# Patient Record
Sex: Female | Born: 1980 | Race: White | Hispanic: No | Marital: Married
Health system: Southern US, Community
[De-identification: ages and names within clinical notes are randomized; demographics above are authoritative.]

## PROBLEM LIST (undated history)

## (undated) ENCOUNTER — Emergency Department (HOSPITAL_COMMUNITY): Admission: EM | Discharge status: Absent without leave | Payer: Self-pay

---

## 2006-10-16 ENCOUNTER — Emergency Department (HOSPITAL_COMMUNITY): Admission: AD | Admit: 2006-10-16 | Discharge: 2006-10-16 | Payer: Self-pay | Admitting: Emergency Medicine

## 2007-07-10 ENCOUNTER — Emergency Department: Payer: Self-pay

## 2009-08-10 ENCOUNTER — Emergency Department (HOSPITAL_COMMUNITY): Admission: EM | Admit: 2009-08-10 | Discharge: 2009-08-10 | Payer: Self-pay | Admitting: Emergency Medicine

## 2010-06-28 ENCOUNTER — Encounter: Payer: Self-pay | Admitting: Family Medicine

## 2010-11-15 NOTE — Letter (Signed)
Summary: Records Dated 03-05-96 thru 03-19-09/[facility]  Records Dated 03-05-96 thru 03-19-09/[facility]   Imported By: Lanelle Bal 06/28/2010 12:12:52  _____________________________________________________________________  External Attachment:    Type:   Image     Comment:   External Document
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
## 2015-08-13 ENCOUNTER — Other Ambulatory Visit: Payer: Self-pay | Admitting: Family Medicine

## 2015-08-13 ENCOUNTER — Ambulatory Visit
Admission: RE | Admit: 2015-08-13 | Discharge: 2015-08-13 | Disposition: A | Discharge status: Home / Self Care | Payer: BLUE CROSS/BLUE SHIELD | Source: Ambulatory Visit | Attending: Family Medicine | Admitting: Family Medicine

## 2015-08-13 ENCOUNTER — Ambulatory Visit: Admission: RE | Admit: 2015-08-13 | Payer: Self-pay | Source: Ambulatory Visit

## 2015-08-13 DIAGNOSIS — J340 Abscess, furuncle and carbuncle of nose: Secondary | ICD-10-CM

## 2015-08-13 DIAGNOSIS — J019 Acute sinusitis, unspecified: Secondary | ICD-10-CM | POA: Diagnosis present

## 2015-08-13 MED ORDER — IOHEXOL 300 MG/ML  SOLN
75.0000 mL | Freq: Once | INTRAMUSCULAR | Status: AC | PRN
  Administered 2015-08-13: 75 mL via INTRAVENOUS

## 2016-12-24 IMAGING — CT CT MAXILLOFACIAL W/ CM
3 series · 16 of 47 positions shown, 19 images · IV contrast (omnipaque)
Comparison: None.

CLINICAL DATA: Left facial pain with nasal swelling and redness as
well as tooth pain worsening 3-4 days.

EXAM:
CT MAXILLOFACIAL WITH CONTRAST
TECHNIQUE: Multidetector CT imaging of the maxillofacial structures was
performed with intravenous contrast. Multiplanar CT image
reconstructions were also generated. A small metallic BB was placed
on the right temple in order to reliably differentiate right from
left.
CONTRAST:  75 mL Omnipaque 300 IV.

[Series 2: max soft · axial · 0.31mm/px · z∈[-229,-81]mm · 10 of 86 slices shown, 13 images]
[im 6/86  brain]
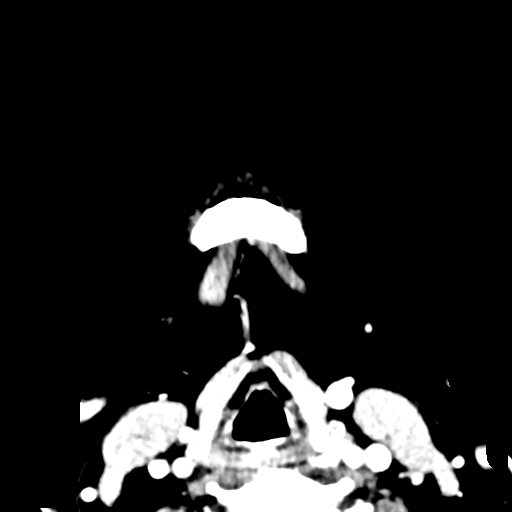
[im 6/86  bone]
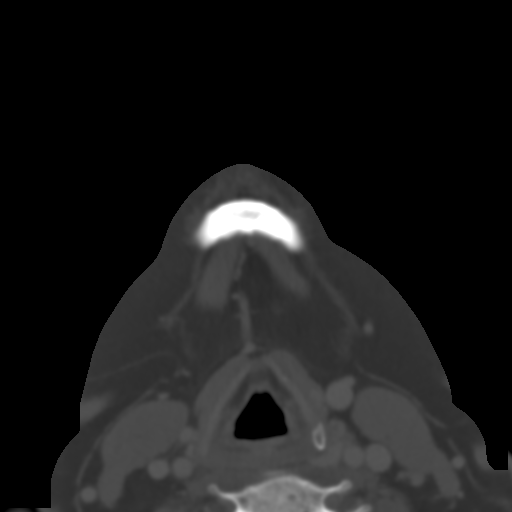
[im 15/86  bone]
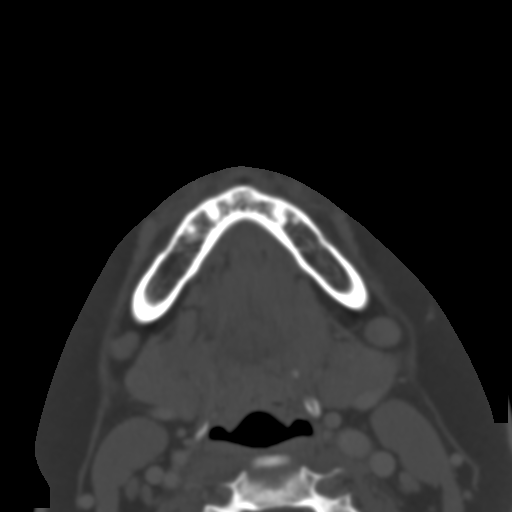
[im 24/86  bone]
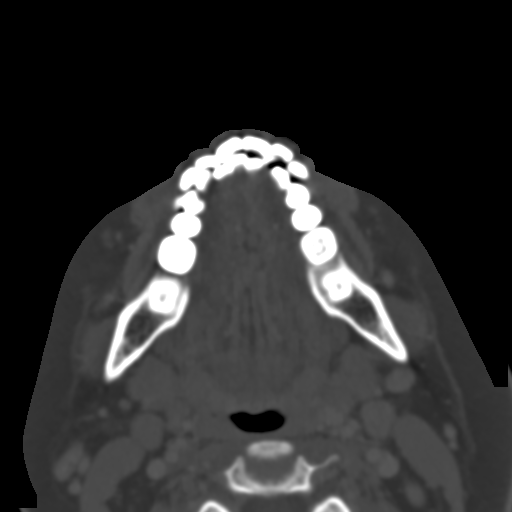
[im 30/86  bone]
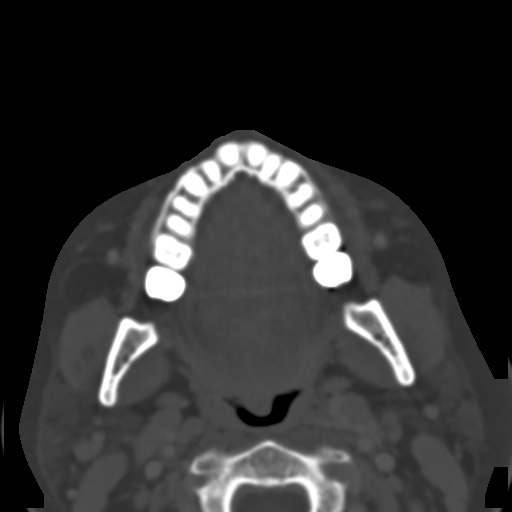
[im 39/86  brain]
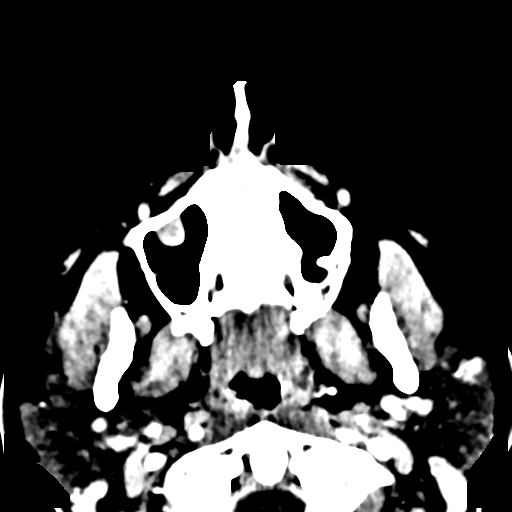
[im 39/86  bone]
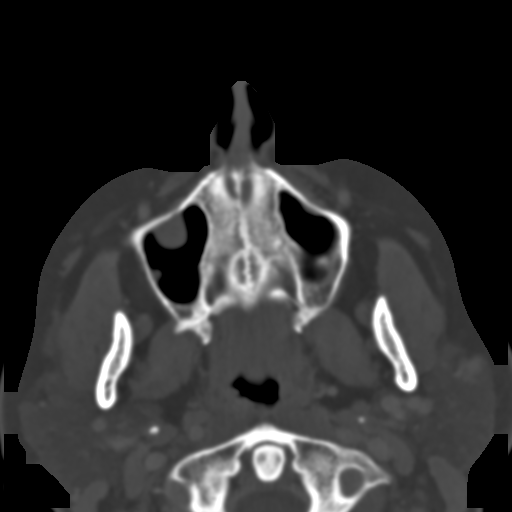
[im 47/86  bone]
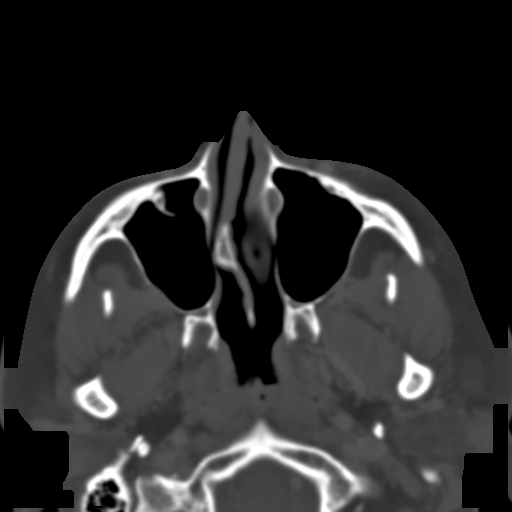
[im 56/86  bone]
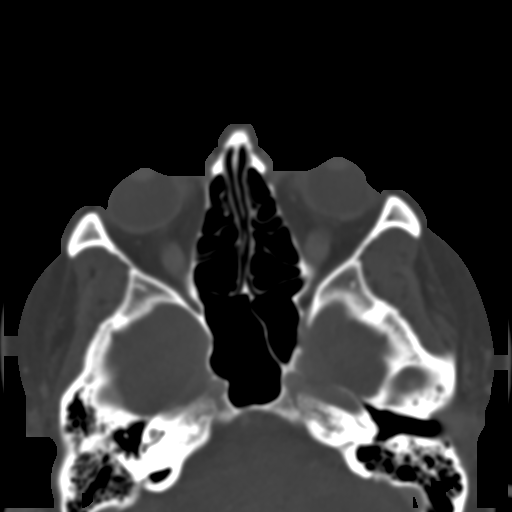
[im 65/86  bone]
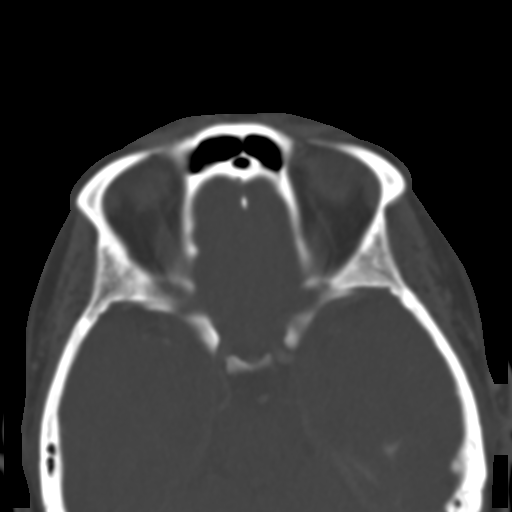
[im 71/86  brain]
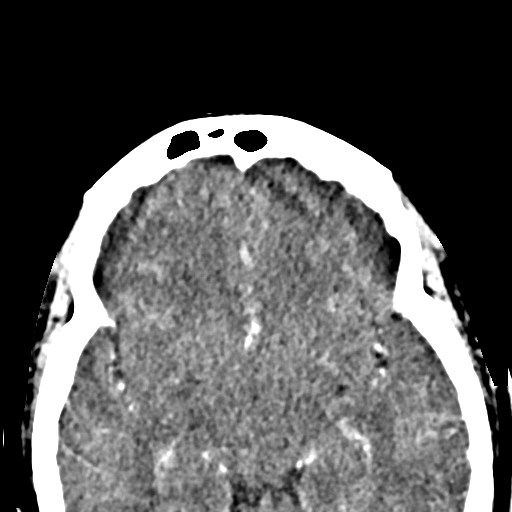
[im 71/86  bone]
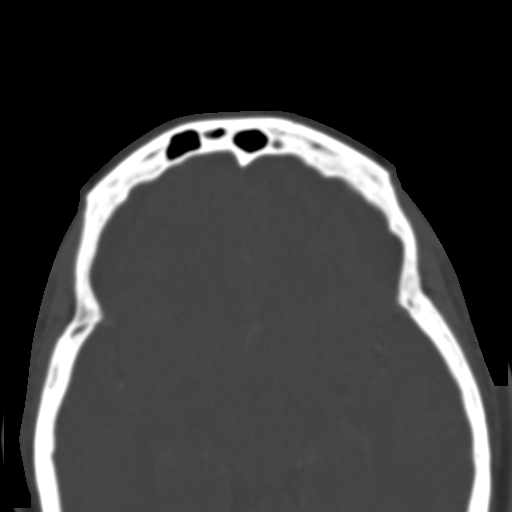
[im 80/86  bone]
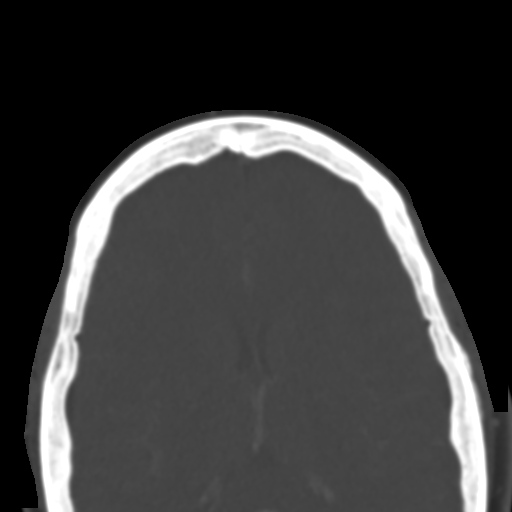

[Series 4: coronal soft · coronal · 0.35mm/px · 3 of 71 slices shown]
[im 24/71  bone]
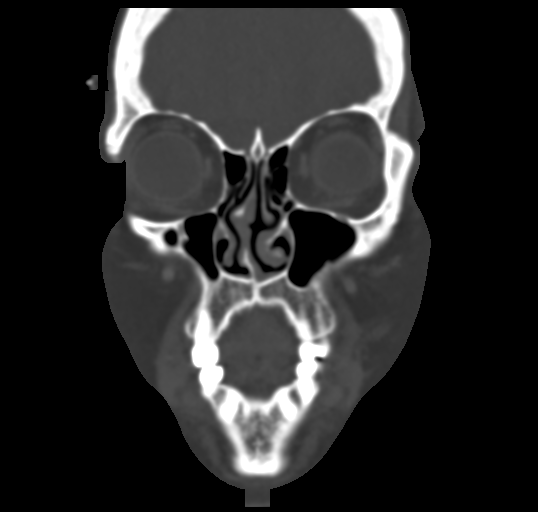
[im 32/71  bone]
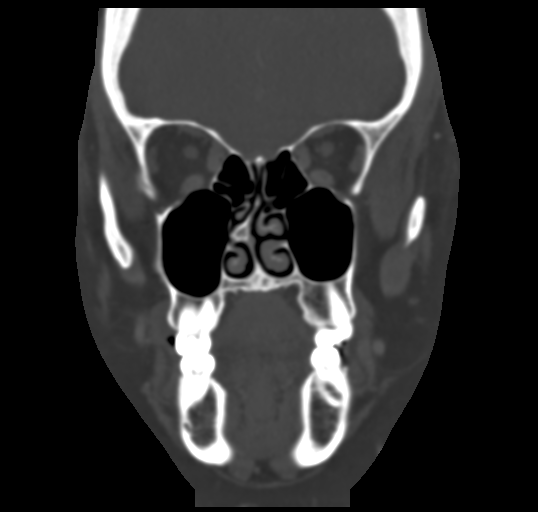
[im 39/71  bone]
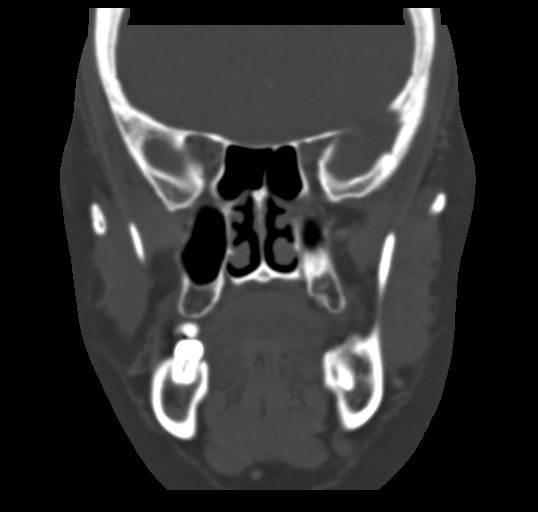

[Series 5: sagittal soft · sagittal · 0.30mm/px · 3 of 85 slices shown]
[im 29/85  bone]
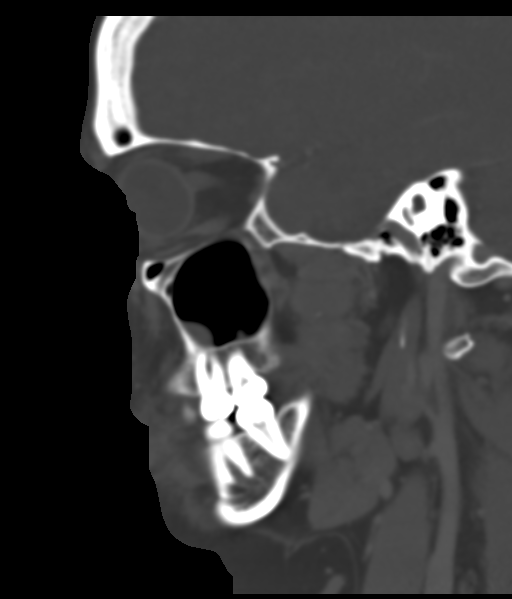
[im 43/85  bone]
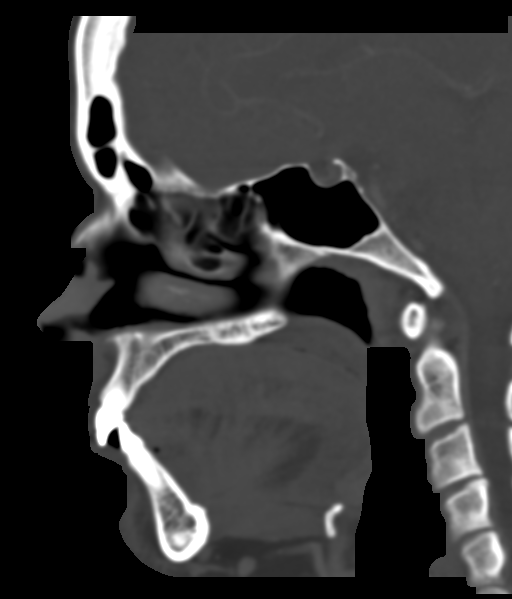
[im 57/85  bone]
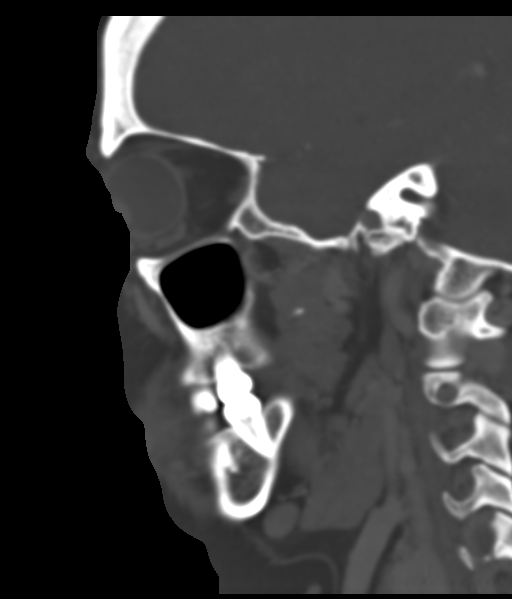

[16 of 47 positions shown; findings below may reference images not displayed]

FINDINGS: Orbits are normal and symmetric. Paranasal sinuses are well aerated
with very minimal mucosal membrane thickening over the floor the
right maxillary sinus. Visualized intracranial structures are
normal.

Salivary glands are normal and symmetric. There is no significant
adenopathy. Pharynx and larynx are within normal. Epiglottis is
normal. No significant periodontal disease. Remainder the exam is
unremarkable.
IMPRESSION: No acute findings.

## 2018-09-16 ENCOUNTER — Encounter: Payer: Self-pay | Admitting: Medical Oncology

## 2018-09-16 ENCOUNTER — Emergency Department
Admission: EM | Admit: 2018-09-16 | Discharge: 2018-09-16 | Disposition: A | Discharge status: Home / Self Care | Payer: Managed Care, Other (non HMO) | Attending: Emergency Medicine | Admitting: Emergency Medicine

## 2018-09-16 DIAGNOSIS — L03114 Cellulitis of left upper limb: Secondary | ICD-10-CM | POA: Insufficient documentation

## 2018-09-16 DIAGNOSIS — M25522 Pain in left elbow: Secondary | ICD-10-CM | POA: Diagnosis present

## 2018-09-16 LAB — CBC WITH DIFFERENTIAL/PLATELET
Abs Immature Granulocytes: 0.03 10*3/uL (ref 0.00–0.07)
BASOS PCT: 1 %
Basophils Absolute: 0.1 10*3/uL (ref 0.0–0.1)
EOS ABS: 0.1 10*3/uL (ref 0.0–0.5)
Eosinophils Relative: 1 %
HCT: 45 % (ref 36.0–46.0)
Hemoglobin: 14.7 g/dL (ref 12.0–15.0)
Immature Granulocytes: 0 %
Lymphocytes Relative: 20 %
Lymphs Abs: 2.1 10*3/uL (ref 0.7–4.0)
MCH: 30.9 pg (ref 26.0–34.0)
MCHC: 32.7 g/dL (ref 30.0–36.0)
MCV: 94.5 fL (ref 80.0–100.0)
MONO ABS: 0.5 10*3/uL (ref 0.1–1.0)
MONOS PCT: 5 %
NEUTROS PCT: 73 %
Neutro Abs: 7.6 10*3/uL (ref 1.7–7.7)
Platelets: 289 10*3/uL (ref 150–400)
RBC: 4.76 MIL/uL (ref 3.87–5.11)
RDW: 12 % (ref 11.5–15.5)
WBC: 10.4 10*3/uL (ref 4.0–10.5)
nRBC: 0 % (ref 0.0–0.2)

## 2018-09-16 LAB — COMPREHENSIVE METABOLIC PANEL
ALK PHOS: 76 U/L (ref 38–126)
ALT: 11 U/L (ref 0–44)
ANION GAP: 11 (ref 5–15)
AST: 17 U/L (ref 15–41)
Albumin: 4.5 g/dL (ref 3.5–5.0)
BILIRUBIN TOTAL: 1.5 mg/dL — AB (ref 0.3–1.2)
BUN: 14 mg/dL (ref 6–20)
CALCIUM: 9.1 mg/dL (ref 8.9–10.3)
CO2: 22 mmol/L (ref 22–32)
Chloride: 106 mmol/L (ref 98–111)
Creatinine, Ser: 0.74 mg/dL (ref 0.44–1.00)
GFR calc Af Amer: >60 mL/min (ref >60)
Glucose, Bld: 84 mg/dL (ref 70–99)
Potassium: 4 mmol/L (ref 3.5–5.1)
Sodium: 139 mmol/L (ref 135–145)
TOTAL PROTEIN: 7.6 g/dL (ref 6.5–8.1)

## 2018-09-16 LAB — CG4 I-STAT (LACTIC ACID)
LACTIC ACID, VENOUS: 2.42 mmol/L — AB (ref 0.5–1.9)
Lactic Acid, Venous: 2.15 mmol/L (ref 0.5–1.9)

## 2018-09-16 MED ORDER — CLINDAMYCIN PHOSPHATE 600 MG/50ML IV SOLN
600.0000 mg | Freq: Once | INTRAVENOUS | Status: AC
  Administered 2018-09-16: 600 mg via INTRAVENOUS
  Filled 2018-09-16 (×2): qty 50

## 2018-09-16 MED ORDER — MORPHINE SULFATE (PF) 4 MG/ML IV SOLN
4.0000 mg | Freq: Once | INTRAVENOUS | Status: AC
  Administered 2018-09-16: 4 mg via INTRAVENOUS
  Filled 2018-09-16: qty 1

## 2018-09-16 MED ORDER — ACETAMINOPHEN 500 MG PO TABS: 1000.0000 mg | ORAL_TABLET | Freq: Once | ORAL | Status: DC

## 2018-09-16 MED ORDER — CLINDAMYCIN HCL 150 MG PO CAPS
300.0000 mg | ORAL_CAPSULE | Freq: Once | ORAL | Status: AC
  Administered 2018-09-16: 300 mg via ORAL
  Filled 2018-09-16: qty 2

## 2018-09-16 MED ORDER — TRAMADOL HCL 50 MG PO TABS: 50.0000 mg | ORAL_TABLET | Freq: Four times a day (QID) | ORAL | 0 refills | Status: AC | PRN

## 2018-09-16 MED ORDER — CLINDAMYCIN HCL 300 MG PO CAPS: 300.0000 mg | ORAL_CAPSULE | Freq: Four times a day (QID) | ORAL | 0 refills | Status: AC

## 2018-09-16 MED ORDER — ONDANSETRON HCL 4 MG/2ML IJ SOLN
4.0000 mg | Freq: Once | INTRAMUSCULAR | Status: AC
  Administered 2018-09-16: 4 mg via INTRAVENOUS
  Filled 2018-09-16: qty 2

## 2018-09-16 MED ORDER — SODIUM CHLORIDE 0.9 % IV BOLUS
1000.0000 mL | Freq: Once | INTRAVENOUS | Status: AC
  Administered 2018-09-16: 1000 mL via INTRAVENOUS

## 2018-09-16 MED ORDER — IBUPROFEN 800 MG PO TABS
800.0000 mg | ORAL_TABLET | Freq: Once | ORAL | Status: AC
  Administered 2018-09-16: 800 mg via ORAL
  Filled 2018-09-16: qty 1

## 2018-09-16 NOTE — ED Notes (Signed)
Pt ambulatory to toilet independently. 

## 2018-09-16 NOTE — ED Notes (Signed)
Edges of cellulitis to left elbow marked with skin pen per this RN.

## 2018-09-16 NOTE — ED Notes (Signed)
Date and time results received: 09/16/18 11:22 AM  (use smartphrase ".now" to insert current time)  Test: Lactic Critical Value: 2.42  Name of Provider Notified: Dr. Shaune PollackLord  Orders Received? Or Actions Taken?: Actions Taken: Order Received

## 2018-09-16 NOTE — Discharge Instructions (Addendum)
You are being treated for cellulitis which is a skin infection of the left arm.  Return to the emergency department if there is any worsening redness, fever, pain, or skin rash that extends beyond the marked skin edge.  Make sure you drink plenty of fluids.

## 2018-09-16 NOTE — ED Provider Notes (Signed)
[facility] Emergency Department Provider Note ____________________________________________   I have reviewed the triage vital signs and the triage nursing note.  HISTORY  Chief Complaint Joint Swelling   Historian Patient  HPI Brittney Webb is a 37 y.o. female presents to the ED for left elbow pain associated with a red rash.  Started with a tiny bump that was tender on Tuesday, approximately 6 days ago at this point.  The last 24 hours is been much more painful when she woke up this morning the redness was much more diffuse and pain was shooting down the left arm as well.  Small area of the skin had drained some clear fluid.  States that she had a history of cellulitis in the past.  She denies nausea vomiting, diarrhea, chest pain, fevers, or any other skin rash areas.  Pain is currently severe and worse with movement.  No trauma or injury to that elbow.     History reviewed. No pertinent past medical history.  There are no active problems to display for this patient.     Prior to Admission medications   Medication Sig Start Date End Date Taking? Authorizing Provider  clindamycin (CLEOCIN) 300 MG capsule Take 1 capsule (300 mg total) by mouth 4 (four) times daily for 40 doses. 09/16/18 09/26/18  Governor Rooks, MD    Allergies  Allergen Reactions  . Ceclor [Cefaclor]   . Erythromycin Nausea Only  . Penicillins Hives  Allergy to penicillin, causes a rash.  No family history on file.  Social History Social History   Tobacco Use  . Smoking status: Not on file  Substance Use Topics  . Alcohol use: Not on file  . Drug use: Not on file    Review of Systems  Constitutional: Negative for fever. Eyes: Negative for visual changes. ENT: Negative for sore throat. Cardiovascular: Negative for chest pain. Respiratory: Negative for shortness of breath. Gastrointestinal: Negative for abdominal pain, vomiting and diarrhea. Genitourinary:  Negative for dysuria. Musculoskeletal: Negative for back pain.  Left arm pain about the left elbow and then extending down the left arm. Skin: Today for cellulitic rash to the left arm around the elbow.   Neurological: Negative for headache.  ____________________________________________   PHYSICAL EXAM:  VITAL SIGNS: ED Triage Vitals [09/16/18 0947]  Enc Vitals Group     BP 129/75     Pulse Rate 60     Resp 16     Temp 98.4 F (36.9 C)     Temp Source Oral     SpO2 99 %     Weight 215 lb (97.5 kg)     Height 5\' 8"  (1.727 m)     Head Circumference      Peak Flow      Pain Score 10     Pain Loc      Pain Edu?      Excl. in GC?      Constitutional: Alert and oriented.  Tearful as she is in pain.  HEENT      Head: Normocephalic and atraumatic.      Eyes: Conjunctivae are normal. Pupils equal and round.       Ears:         Nose: No congestion/rhinnorhea.      Mouth/Throat: Mucous membranes are moist.      Neck: No stridor. Cardiovascular/Chest: Normal rate, regular rhythm.  No murmurs, rubs, or gallops. Respiratory: Normal respiratory effort without tachypnea nor retractions. Breath sounds are clear  and equal bilaterally. No wheezes/rales/rhonchi. Gastrointestinal: Soft. No distention, no guarding, no rebound. Nontender.  Genitourinary/rectal:Deferred Musculoskeletal: Skin of the left elbow has a cellulitic rash with a tiny scab that looks like it may have drained.  There is some induration without any fluctuance.  No joint effusion.  She does have pain when she tries to flex the elbow. Neurologic:  Normal speech and language. No gross or focal neurologic deficits are appreciated. Skin:  Skin is warm, dry.  Left elbow cellulitic rash from the mid upper arm to the mid lower arm. Psychiatric: Mood and affect are normal. Speech and behavior are normal. Patient exhibits appropriate insight and judgment.   ____________________________________________  LABS (pertinent  positives/negatives) I, Governor Rooksebecca Giovanna Kemmerer, MD the attending physician have reviewed the labs noted below.  Labs Reviewed  COMPREHENSIVE METABOLIC PANEL - Abnormal; Notable for the following components:      Result Value   Total Bilirubin 1.5 (*)    All other components within normal limits  CG4 I-STAT (LACTIC ACID) - Abnormal; Notable for the following components:   Lactic Acid, Venous 2.42 (*)    All other components within normal limits  CG4 I-STAT (LACTIC ACID) - Abnormal; Notable for the following components:   Lactic Acid, Venous 2.15 (*)    All other components within normal limits  CBC WITH DIFFERENTIAL/PLATELET  LACTIC ACID, PLASMA  LACTIC ACID, PLASMA  I-STAT CG4 LACTIC ACID, ED  I-STAT CG4 LACTIC ACID, ED    ____________________________________________    EKG I, Governor Rooksebecca Kemiah Booz, MD, the attending physician have personally viewed and interpreted all ECGs.  None ____________________________________________  RADIOLOGY   None __________________________________________  PROCEDURES  Procedure(s) performed: None  Procedures  Critical Care performed: None   ____________________________________________  ED COURSE / ASSESSMENT AND PLAN  Pertinent labs & imaging results that were available during my care of the patient were reviewed by me and considered in my medical decision making (see chart for details).     Patient is overall well-appearing.  Her lactate was slightly elevated, but upon repeat after IV fluids was decreasing.  I do not think that she is showing signs of sepsis without systemic symptoms.  She is not febrile.  Her white blood cell count is normal at 10.4.  Blood cultures were sent.  Patient was given her initial dose of clindamycin here.  We discussed observation versus outpatient, and she is comfortable going home I think this is quite reasonable.    CONSULTATIONS:   None  Patient / Family / Caregiver informed of clinical course, medical  decision-making process, and agree with plan.   I discussed return precautions, follow-up instructions, and discharge instructions with patient and/or family.  Discharge Instructions : You are being treated for cellulitis which is a skin infection of the left arm.  Return to the emergency department if there is any worsening redness, fever, pain, or skin rash that extends beyond the marked skin edge.  Make sure you drink plenty of fluids.      ___________________________________________   FINAL CLINICAL IMPRESSION(S) / ED DIAGNOSES   Final diagnoses:  Cellulitis of left arm      ___________________________________________         Note: This dictation was prepared with Dragon dictation. Any transcriptional errors that result from this process are unintentional    Governor RooksLord, [place], MD 09/16/18 1622

## 2018-09-16 NOTE — ED Triage Notes (Signed)
Pt reports that she began having "bump" on her left elbow area is now red and swollen and painful.
